# Patient Record
Sex: Male | Born: 2000 | Hispanic: No | Marital: Single
Health system: Midwestern US, Community
[De-identification: ages and names within clinical notes are randomized; demographics above are authoritative.]

## PROBLEM LIST (undated history)

## (undated) DIAGNOSIS — E663 Overweight: Secondary | ICD-10-CM

## (undated) DIAGNOSIS — Q539 Undescended testicle, unspecified: Secondary | ICD-10-CM

## (undated) DIAGNOSIS — H5210 Myopia, unspecified eye: Secondary | ICD-10-CM

## (undated) HISTORY — DX: Overweight: E66.3

## (undated) HISTORY — PX: CIRCUMCISION: SUR203

## (undated) HISTORY — DX: Undescended testicle, unspecified: Q53.9

## (undated) HISTORY — DX: Myopia, unspecified eye: H52.10

---
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
## 2023-05-27 ENCOUNTER — Other Ambulatory Visit: Payer: Self-pay

## 2023-05-28 ENCOUNTER — Encounter (HOSPITAL_COMMUNITY): Payer: Self-pay

## 2023-05-28 ENCOUNTER — Emergency Department (HOSPITAL_COMMUNITY)
Admission: EM | Admit: 2023-05-28 | Discharge: 2023-05-28 | Disposition: A | Discharge status: Home / Self Care | Payer: No Typology Code available for payment source | Attending: Emergency Medicine | Admitting: Emergency Medicine

## 2023-05-28 ENCOUNTER — Emergency Department (HOSPITAL_COMMUNITY): Payer: No Typology Code available for payment source

## 2023-05-28 ENCOUNTER — Other Ambulatory Visit: Payer: Self-pay

## 2023-05-28 DIAGNOSIS — Y990 Civilian activity done for income or pay: Secondary | ICD-10-CM | POA: Insufficient documentation

## 2023-05-28 DIAGNOSIS — S0001XA Abrasion of scalp, initial encounter: Secondary | ICD-10-CM | POA: Diagnosis not present

## 2023-05-28 DIAGNOSIS — W228XXA Striking against or struck by other objects, initial encounter: Secondary | ICD-10-CM | POA: Insufficient documentation

## 2023-05-28 DIAGNOSIS — S0990XA Unspecified injury of head, initial encounter: Secondary | ICD-10-CM

## 2023-05-28 DIAGNOSIS — H6123 Impacted cerumen, bilateral: Secondary | ICD-10-CM | POA: Insufficient documentation

## 2023-05-28 NOTE — ED Notes (Signed)
ED Provider at bedside. 

## 2023-05-28 NOTE — Discharge Instructions (Addendum)
CT scan is normal. Safe to return to work.  Use debrox drops for hard earwax and then allow to sit overnight in your ears and rinse out in shower the following morning. Avoid using q tips in your ears.
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
